# Patient Record
Sex: Female | Born: 2000 | Race: Black or African American | Hispanic: No | Marital: Single | Smoking: Never smoker
Health system: Southern US, Community
[De-identification: ages and names within clinical notes are randomized; demographics above are authoritative.]

## PROBLEM LIST (undated history)

## (undated) ENCOUNTER — Emergency Department (HOSPITAL_COMMUNITY): Admission: EM | Payer: Medicaid Other | Source: Home / Self Care

## (undated) DIAGNOSIS — N390 Urinary tract infection, site not specified: Secondary | ICD-10-CM

## (undated) DIAGNOSIS — G43909 Migraine, unspecified, not intractable, without status migrainosus: Secondary | ICD-10-CM

---
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
## 2020-05-11 ENCOUNTER — Other Ambulatory Visit: Payer: Self-pay

## 2020-05-11 DIAGNOSIS — H1031 Unspecified acute conjunctivitis, right eye: Secondary | ICD-10-CM | POA: Diagnosis not present

## 2020-05-11 DIAGNOSIS — H5711 Ocular pain, right eye: Secondary | ICD-10-CM | POA: Diagnosis present
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
## 2020-05-12 ENCOUNTER — Encounter (HOSPITAL_BASED_OUTPATIENT_CLINIC_OR_DEPARTMENT_OTHER): Payer: Self-pay | Admitting: Emergency Medicine

## 2020-05-12 ENCOUNTER — Emergency Department (HOSPITAL_BASED_OUTPATIENT_CLINIC_OR_DEPARTMENT_OTHER)
Admission: EM | Admit: 2020-05-12 | Discharge: 2020-05-12 | Disposition: A | Discharge status: Home / Self Care | Payer: Medicaid Other | Attending: Emergency Medicine | Admitting: Emergency Medicine

## 2020-05-12 DIAGNOSIS — H1031 Unspecified acute conjunctivitis, right eye: Secondary | ICD-10-CM

## 2020-05-12 MED ORDER — FLUORESCEIN SODIUM 1 MG OP STRP
1.0000 | ORAL_STRIP | Freq: Once | OPHTHALMIC | Status: AC
  Administered 2020-05-12: 1 via OPHTHALMIC
  Filled 2020-05-12: qty 1

## 2020-05-12 MED ORDER — ERYTHROMYCIN 5 MG/GM OP OINT
TOPICAL_OINTMENT | Freq: Once | OPHTHALMIC | Status: AC
  Administered 2020-05-12: 1 via OPHTHALMIC
  Filled 2020-05-12: qty 3.5

## 2020-05-12 MED ORDER — ERYTHROMYCIN 5 MG/GM OP OINT: TOPICAL_OINTMENT | OPHTHALMIC | 0 refills | Status: AC

## 2020-05-12 MED ORDER — ERYTHROMYCIN 5 MG/GM OP OINT: TOPICAL_OINTMENT | OPHTHALMIC | 0 refills | Status: DC

## 2020-05-12 MED ORDER — PROPARACAINE HCL 0.5 % OP SOLN
1.0000 [drp] | Freq: Once | OPHTHALMIC | Status: AC
  Administered 2020-05-12: 1 [drp] via OPHTHALMIC
  Filled 2020-05-12: qty 15

## 2020-05-12 NOTE — ED Notes (Signed)
Pt does not wear glasses or contacts 

## 2020-05-12 NOTE — ED Provider Notes (Signed)
[facility] EMERGENCY DEPARTMENT Provider Note   CSN: [id number] Arrival date & time: 05/11/20  2352     History Chief Complaint  Patient presents with  . Eye Pain    Susan Madden is a 20 y.o. female.  The history is provided by the patient.  Eye Pain   Susan Madden is a 20 y.o. female who presents to the Emergency Department complaining of something in her eye. She states that around 1 o'clock she felt like something fluid in her eye. She is unsure what the object was. Since that time she has been feeling grainy, foreign body sensation in the eye. No significant pain or vision changes. She does report increased drainage from the eye. She does not wear corrective lenses. She has no known medical problems. Symptoms are mild to moderate and constant nature.    Past Medical History:  Diagnosis Date  . Migraine   . UTI (urinary tract infection)     There are no problems to display for this patient.   History reviewed. No pertinent surgical history.   OB History   No obstetric history on file.     No family history on file.  Social History   Tobacco Use  . Smoking status: Never Smoker  . Smokeless tobacco: Never Used  Vaping Use  . Vaping Use: Never used  Substance Use Topics  . Alcohol use: No  . Drug use: No    Home Medications Prior to Admission medications   Not on File    Allergies    Patient has no known allergies.  Review of Systems   Review of Systems  Eyes: Positive for pain.  All other systems reviewed and are negative.   Physical Exam Updated Vital Signs BP 107/73 (BP Location: Left Arm)   Pulse 71   Temp 98.3 F (36.8 C) (Oral)   Resp 16   Ht 5' (1.524 m)   Wt 56.7 kg   LMP 04/24/2020 (Exact Date)   SpO2 100%   BMI 24.41 kg/m   Physical Exam Vitals and nursing note reviewed.  Constitutional:      Appearance: She is well-developed.  HENT:     Head: Normocephalic and atraumatic.     Comments: Pupils equal round and  reactive, EOM I. The right eye has diffuse conjunctival injection. There is moderate thick discharge from the right eye. No foreign body visualize. Cardiovascular:     Rate and Rhythm: Normal rate and regular rhythm.  Pulmonary:     Effort: Pulmonary effort is normal. No respiratory distress.  Musculoskeletal:        General: No tenderness.  Skin:    General: Skin is warm and dry.  Neurological:     Mental Status: She is alert and oriented to person, place, and time.  Psychiatric:        Behavior: Behavior normal.     ED Results / Procedures / Treatments   Labs (all labs ordered are listed, but only abnormal results are displayed) Labs Reviewed - No data to display  EKG None  Radiology No results found.  Procedures Procedures   Medications Ordered in ED Medications  erythromycin ophthalmic ointment (has no administration in time range)  proparacaine (ALCAINE) 0.5 % ophthalmic solution 1 drop (1 drop Both Eyes Given by Other 05/12/20 0131)  fluorescein ophthalmic strip 1 strip (1 strip Both Eyes Given by Other 05/12/20 0131)    ED Course  I have reviewed the triage vital signs and  the nursing notes.  Pertinent labs & imaging results that were available during my care of the patient were reviewed by me and considered in my medical decision making (see chart for details).    MDM Rules/Calculators/A&P                         patient here for evaluation of right eye irritation, feels like there is a foreign body present. On examination there is no clear evidence of foreign body. She does have conjunctival injection as well as discharge, will treat for possible conjunctivitis. Recommend ophthalmology follow-up to further evaluate for possible foreign body. Return precautions discussed.  Final Clinical Impression(s) / ED Diagnoses Final diagnoses:  Acute conjunctivitis of right eye, unspecified acute conjunctivitis type    Rx / DC Orders ED Discharge Orders    None        Tilden Fossa, MD 05/12/20 [phone]

## 2021-05-22 ENCOUNTER — Emergency Department (HOSPITAL_BASED_OUTPATIENT_CLINIC_OR_DEPARTMENT_OTHER)
Admission: EM | Admit: 2021-05-22 | Discharge: 2021-05-22 | Disposition: A | Discharge status: Home / Self Care | Payer: Medicaid Other | Attending: Emergency Medicine | Admitting: Emergency Medicine

## 2021-05-22 ENCOUNTER — Other Ambulatory Visit: Payer: Self-pay

## 2021-05-22 ENCOUNTER — Encounter (HOSPITAL_BASED_OUTPATIENT_CLINIC_OR_DEPARTMENT_OTHER): Payer: Self-pay

## 2021-05-22 DIAGNOSIS — S81812A Laceration without foreign body, left lower leg, initial encounter: Secondary | ICD-10-CM

## 2021-05-22 DIAGNOSIS — Y990 Civilian activity done for income or pay: Secondary | ICD-10-CM | POA: Insufficient documentation

## 2021-05-22 DIAGNOSIS — W268XXA Contact with other sharp object(s), not elsewhere classified, initial encounter: Secondary | ICD-10-CM | POA: Insufficient documentation

## 2021-05-22 DIAGNOSIS — Z23 Encounter for immunization: Secondary | ICD-10-CM | POA: Insufficient documentation

## 2021-05-22 MED ORDER — TETANUS-DIPHTH-ACELL PERTUSSIS 5-2.5-18.5 LF-MCG/0.5 IM SUSY
0.5000 mL | PREFILLED_SYRINGE | Freq: Once | INTRAMUSCULAR | Status: AC
  Administered 2021-05-22: 0.5 mL via INTRAMUSCULAR
  Filled 2021-05-22: qty 0.5

## 2021-05-22 MED ORDER — LIDOCAINE-EPINEPHRINE (PF) 2 %-1:200000 IJ SOLN
10.0000 mL | Freq: Once | INTRAMUSCULAR | Status: AC
  Administered 2021-05-22: 10 mL
  Filled 2021-05-22: qty 20

## 2021-05-22 NOTE — Discharge Instructions (Signed)
Follow-up with primary care doctor or come back to ER in 10 to 14 days for wound recheck and suture removal.  Recommend keeping wound clean, dry, protect area with gauze dressing.  Come back to ER sooner if you develop redness, drainage, fever or other new concerning symptom. ?

## 2021-05-22 NOTE — ED Notes (Signed)
Pt A&OX4 ambulatory at d/c with independent steady gait. Pt verbalized understanding of d/c instructions and follow up care. 

## 2021-05-22 NOTE — ED Provider Notes (Signed)
?[facility] EMERGENCY DEPARTMENT ?Provider Note ? ? ?CSN: [id number] ?Arrival date & time: 05/22/21  1643 ? ?  ? ?History ? ?Chief Complaint  ?Patient presents with  ? Foot Injury  ? ? ?MICAILA Madden is a 21 y.o. female.  Presented to ER for foot injury.  Patient states that while she was at work today she cut her left lower leg.  Bleeding stopped with direct pressure.  Unsure of last tetanus.  Denies any ongoing pain.  No other injuries. ? ?HPI ? ?  ? ?Home Medications ?Prior to Admission medications   ?Medication Sig Start Date End Date Taking? Authorizing Provider  ?erythromycin ophthalmic ointment Place a 1/2 inch ribbon of ointment into the lower eyelid four times daily 05/12/20   Tilden Fossa, MD  ?   ? ?Allergies    ?Patient has no known allergies.   ? ?Review of Systems   ?Review of Systems  ?Constitutional:  Negative for chills and fever.  ?HENT:  Negative for ear pain and sore throat.   ?Eyes:  Negative for pain and visual disturbance.  ?Respiratory:  Negative for cough and shortness of breath.   ?Cardiovascular:  Negative for chest pain and palpitations.  ?Gastrointestinal:  Negative for abdominal pain and vomiting.  ?Genitourinary:  Negative for dysuria and hematuria.  ?Musculoskeletal:  Negative for arthralgias and back pain.  ?Skin:  Positive for wound. Negative for color change and rash.  ?Neurological:  Negative for seizures and syncope.  ?All other systems reviewed and are negative. ? ?Physical Exam ?Updated Vital Signs ?BP (!) 129/96 (BP Location: Left Arm)   Pulse 70   Temp 98.8 ?F (37.1 ?C) (Oral)   Resp 16   LMP 05/22/2021 (Exact Date)   SpO2 100%  ?Physical Exam ?Vitals and nursing note reviewed.  ?Constitutional:   ?   General: She is not in acute distress. ?   Appearance: She is well-developed.  ?HENT:  ?   Head: Normocephalic and atraumatic.  ?Eyes:  ?   Conjunctiva/sclera: Conjunctivae normal.  ?Cardiovascular:  ?   Rate and Rhythm: Normal rate.  ?   Pulses: Normal pulses.   ?Pulmonary:  ?   Effort: Pulmonary effort is normal. No respiratory distress.  ?Abdominal:  ?   Palpations: Abdomen is soft.  ?   Tenderness: There is no abdominal tenderness.  ?Musculoskeletal:     ?   General: No swelling.  ?   Cervical back: Neck supple.  ?   Comments: There is approximately 2 cm laceration across the anterior aspect of the mid lower leg, no active bleeding noted, DP and PT pulses are intact, sensation and distal motor intact  ?Skin: ?   General: Skin is warm and dry.  ?   Capillary Refill: Capillary refill takes less than 2 seconds.  ?Neurological:  ?   General: No focal deficit present.  ?   Mental Status: She is alert.  ?   Comments: Sensation to foot intact distal to the left leg  ?Psychiatric:     ?   Mood and Affect: Mood normal.  ? ? ?ED Results / Procedures / Treatments   ?Labs ?(all labs ordered are listed, but only abnormal results are displayed) ?Labs Reviewed - No data to display ? ?EKG ?None ? ?Radiology ?No results found. ? ?Procedures ?Marland Kitchen.Laceration Repair ? ?Date/Time: 05/22/2021 5:42 PM ?Performed by: Milagros Loll, MD ?Authorized by: Milagros Loll, MD  ? ?Consent:  ?  Consent obtained:  Verbal ?  Consent given by:  Patient ?  Risks, benefits, and alternatives were discussed: yes   ?  Risks discussed:  Infection, need for additional repair, nerve damage, poor wound healing, poor cosmetic result and pain ?  Alternatives discussed:  No treatment ?Universal protocol:  ?  Immediately prior to procedure, a time out was called: yes   ?  Patient identity confirmed:  Verbally with patient ?Anesthesia:  ?  Anesthesia method:  Local infiltration ?  Local anesthetic:  Lidocaine 2% WITH epi ?Laceration details:  ?  Location:  Leg ?  Leg location:  L lower leg ?  Length (cm):  2 ?Exploration:  ?  Wound extent: no fascia violation noted, no muscle damage noted, no tendon damage noted and no vascular damage noted   ?Treatment:  ?  Area cleansed with:  Saline ?  Amount of cleaning:   Extensive ?  Irrigation solution:  Sterile saline ?  Irrigation method:  Syringe ?  Debridement:  None ?Skin repair:  ?  Repair method:  Sutures ?  Suture size:  4-0 ?  Suture material:  Nylon ?  Suture technique:  Simple interrupted and horizontal mattress ?  Number of sutures: 1 horizontal mattress, 1 simple interrupted. ?Repair type:  ?  Repair type:  Simple ?Post-procedure details:  ?  Dressing:  Tube gauze ?  Procedure completion:  Tolerated well, no immediate complications  ? ? ?Medications Ordered in ED ?Medications  ?lidocaine-EPINEPHrine (XYLOCAINE W/EPI) 2 %-1:200000 (PF) injection 10 mL (has no administration in time range)  ?Tdap (BOOSTRIX) injection 0.5 mL (has no administration in time range)  ? ? ?ED Course/ Medical Decision Making/ A&P ?  ?                        ?Medical Decision Making ?Risk ?Prescription drug management. ? ? ?21 year old girl presents to ER for left leg laceration.  Patient has about a 2 cm laceration to the anterior portion of her distal/mid lower leg.  Neurovascularly intact.  Performed laceration repair.  Good wound approximation.  Updated tetanus, discharged home.  Advised follow-up in 10 to 14 days for suture removal. ? ? ?After the discussed management above, the patient was determined to be safe for discharge.  The patient was in agreement with this plan and all questions regarding their care were answered.  ED return precautions were discussed and the patient will return to the ED with any significant worsening of condition. ? ? ? ? ? ? ? ?Final Clinical Impression(s) / ED Diagnoses ?Final diagnoses:  ?None  ? ? ?Rx / DC Orders ?ED Discharge Orders   ? ? None  ? ?  ? ? ?  ?Milagros Loll, MD ?05/22/21 1743 ? ?

## 2021-05-22 NOTE — ED Triage Notes (Signed)
Pt reports she cut her anterior left lower leg above her foot today at work; states she slipped on melted ice and her foot slid under a drink machine and cut her leg. Bleeding controlled at this time. Pt ambulatory with independent steady gait. ?
